# Patient Record
Sex: Female | Born: 1955 | Race: White | Hispanic: No | Marital: Married | Smoking: Never smoker
Health system: Southern US, Community
[De-identification: ages and names within clinical notes are randomized; demographics above are authoritative.]

## PROBLEM LIST (undated history)

## (undated) DIAGNOSIS — F319 Bipolar disorder, unspecified: Secondary | ICD-10-CM

## (undated) DIAGNOSIS — K269 Duodenal ulcer, unspecified as acute or chronic, without hemorrhage or perforation: Secondary | ICD-10-CM

## (undated) DIAGNOSIS — R519 Headache, unspecified: Secondary | ICD-10-CM

## (undated) DIAGNOSIS — F32A Depression, unspecified: Secondary | ICD-10-CM

## (undated) DIAGNOSIS — M1990 Unspecified osteoarthritis, unspecified site: Secondary | ICD-10-CM

## (undated) DIAGNOSIS — E119 Type 2 diabetes mellitus without complications: Secondary | ICD-10-CM

## (undated) DIAGNOSIS — G4733 Obstructive sleep apnea (adult) (pediatric): Secondary | ICD-10-CM

## (undated) DIAGNOSIS — E785 Hyperlipidemia, unspecified: Secondary | ICD-10-CM

## (undated) DIAGNOSIS — R51 Headache: Secondary | ICD-10-CM

## (undated) DIAGNOSIS — E559 Vitamin D deficiency, unspecified: Secondary | ICD-10-CM

## (undated) DIAGNOSIS — F329 Major depressive disorder, single episode, unspecified: Secondary | ICD-10-CM

## (undated) DIAGNOSIS — G43909 Migraine, unspecified, not intractable, without status migrainosus: Secondary | ICD-10-CM

## (undated) DIAGNOSIS — K219 Gastro-esophageal reflux disease without esophagitis: Secondary | ICD-10-CM

## (undated) HISTORY — PX: CHOLECYSTECTOMY: SHX55

## (undated) HISTORY — PX: THUMB ARTHROSCOPY: SHX2509

## (undated) HISTORY — PX: JOINT REPLACEMENT: SHX530

## (undated) HISTORY — PX: TOTAL KNEE ARTHROPLASTY: SHX125

## (undated) HISTORY — PX: REPLACEMENT TOTAL KNEE: SUR1224

## (undated) HISTORY — PX: LIVER BIOPSY: SHX301

---

## 2004-05-29 DIAGNOSIS — E1169 Type 2 diabetes mellitus with other specified complication: Secondary | ICD-10-CM | POA: Insufficient documentation

## 2011-03-14 DIAGNOSIS — L821 Other seborrheic keratosis: Secondary | ICD-10-CM | POA: Insufficient documentation

## 2011-03-14 DIAGNOSIS — E785 Hyperlipidemia, unspecified: Secondary | ICD-10-CM | POA: Insufficient documentation

## 2011-03-14 DIAGNOSIS — K760 Fatty (change of) liver, not elsewhere classified: Secondary | ICD-10-CM | POA: Insufficient documentation

## 2011-03-14 DIAGNOSIS — G43909 Migraine, unspecified, not intractable, without status migrainosus: Secondary | ICD-10-CM | POA: Insufficient documentation

## 2011-03-14 DIAGNOSIS — K219 Gastro-esophageal reflux disease without esophagitis: Secondary | ICD-10-CM | POA: Insufficient documentation

## 2011-03-14 DIAGNOSIS — M1711 Unilateral primary osteoarthritis, right knee: Secondary | ICD-10-CM | POA: Insufficient documentation

## 2012-08-08 DIAGNOSIS — E221 Hyperprolactinemia: Secondary | ICD-10-CM | POA: Insufficient documentation

## 2013-03-04 DIAGNOSIS — G4733 Obstructive sleep apnea (adult) (pediatric): Secondary | ICD-10-CM | POA: Insufficient documentation

## 2013-05-11 DIAGNOSIS — E559 Vitamin D deficiency, unspecified: Secondary | ICD-10-CM | POA: Insufficient documentation

## 2013-05-11 DIAGNOSIS — Z79899 Other long term (current) drug therapy: Secondary | ICD-10-CM | POA: Insufficient documentation

## 2013-08-05 DIAGNOSIS — R7689 Other specified abnormal immunological findings in serum: Secondary | ICD-10-CM | POA: Insufficient documentation

## 2013-08-05 DIAGNOSIS — R768 Other specified abnormal immunological findings in serum: Secondary | ICD-10-CM | POA: Insufficient documentation

## 2013-10-06 DIAGNOSIS — R002 Palpitations: Secondary | ICD-10-CM | POA: Insufficient documentation

## 2013-11-10 DIAGNOSIS — R079 Chest pain, unspecified: Secondary | ICD-10-CM | POA: Insufficient documentation

## 2014-01-31 ENCOUNTER — Ambulatory Visit: Payer: Self-pay | Admitting: Family Medicine

## 2014-09-01 DIAGNOSIS — H109 Unspecified conjunctivitis: Secondary | ICD-10-CM | POA: Insufficient documentation

## 2014-11-06 DIAGNOSIS — E0590 Thyrotoxicosis, unspecified without thyrotoxic crisis or storm: Secondary | ICD-10-CM | POA: Insufficient documentation

## 2015-02-20 ENCOUNTER — Encounter: Payer: Self-pay | Admitting: Gynecology

## 2015-02-20 ENCOUNTER — Ambulatory Visit
Admission: EM | Admit: 2015-02-20 | Discharge: 2015-02-20 | Disposition: A | Discharge status: Home / Self Care | Payer: BC Managed Care – PPO | Attending: Internal Medicine | Admitting: Internal Medicine

## 2015-02-20 DIAGNOSIS — A6920 Lyme disease, unspecified: Secondary | ICD-10-CM

## 2015-02-20 HISTORY — DX: Vitamin D deficiency, unspecified: E55.9

## 2015-02-20 HISTORY — DX: Type 2 diabetes mellitus without complications: E11.9

## 2015-02-20 HISTORY — DX: Unspecified osteoarthritis, unspecified site: M19.90

## 2015-02-20 HISTORY — DX: Hyperlipidemia, unspecified: E78.5

## 2015-02-20 HISTORY — DX: Depression, unspecified: F32.A

## 2015-02-20 HISTORY — DX: Obstructive sleep apnea (adult) (pediatric): G47.33

## 2015-02-20 HISTORY — DX: Duodenal ulcer, unspecified as acute or chronic, without hemorrhage or perforation: K26.9

## 2015-02-20 HISTORY — DX: Major depressive disorder, single episode, unspecified: F32.9

## 2015-02-20 HISTORY — DX: Gastro-esophageal reflux disease without esophagitis: K21.9

## 2015-02-20 HISTORY — DX: Migraine, unspecified, not intractable, without status migrainosus: G43.909

## 2015-02-20 HISTORY — DX: Bipolar disorder, unspecified: F31.9

## 2015-02-20 MED ORDER — DOXYCYCLINE HYCLATE 100 MG PO CAPS: 100.0000 mg | ORAL_CAPSULE | Freq: Two times a day (BID) | ORAL | Status: DC

## 2015-02-20 MED ORDER — KETOCONAZOLE 2 % EX CREA: 1.0000 "application " | TOPICAL_CREAM | Freq: Every day | CUTANEOUS | Status: DC

## 2015-02-20 NOTE — Discharge Instructions (Signed)
Prescription for doxycycline (for lyme disease) was sent to the [facility] in [place]. Early lyme disease is a clinical diagnosis, so no labs were drawn today. Would consider possibility of tinea (ringworm) if rash does not resolve fairly quickly (marked improvement within 3-5 days) with treatment for lyme. Prescription for ketoconazole cream (anti fungal) was sent to pharmacy and can be started if not improving in a few days with lyme treatment.

## 2015-02-20 NOTE — ED Provider Notes (Signed)
CSN: [id number]     Arrival date & time 02/20/15  1001 History   First MD Initiated Contact with Patient 02/20/15 1047     Chief Complaint  Patient presents with  . Rash   HPI Patient is a 59 year old lady was recently diagnosed with duodenal ulcers. She has multiple medication sensitivities. She had some itching under her right breast this morning, and inspection revealed 2 concentric large ringlike red lesions. She does have a dog, although she is unaware of a specific tick bite. She has had some tactile temperature in the last couple days but no objective fever. She has had some achiness in her bilateral hips.  Past Medical History  Diagnosis Date  . Duodenal ulcer   . Migraine   . Arthritis   . Depression   . Diabetes mellitus without complication   . Bipolar 1 disorder   . Hyperlipidemia   . GERD (gastroesophageal reflux disease)   . Vitamin D deficiency   . OSA (obstructive sleep apnea)    Past Surgical History  Procedure Laterality Date  . Cesarean section    . Joint replacement    . Total knee arthroplasty    . Thumb arthroscopy    . Cholecystectomy    . 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   Substance Use Topics  . Smoking status: Never Smoker   . Smokeless tobacco: None  . Alcohol Use: No    Review of Systems  All other systems reviewed and are negative.   Allergies  Roxicodone; Statins; Tape; Ciprofloxacin; and Latuda  Home Medications   Prior to Admission medications   Medication Sig Start Date End Date Taking? Authorizing Provider  ARIPiprazole (ABILIFY) 2 MG tablet Take 2 mg by mouth daily.   Yes Historical Provider, MD  cholecalciferol (VITAMIN D) 1000 UNITS tablet Take 3,000 Units by mouth daily.   Yes Historical Provider, MD  glucosamine-chondroitin 500-400 MG tablet Take 1  tablet by mouth 3 (three) times daily.   Yes Historical Provider, MD  glucose blood test strip 1 each by Other route as needed for other. Use as instructed   Yes Historical Provider, MD  L-Methylfolate (DEPLIN PO) Take by mouth.   Yes Historical Provider, MD  lamoTRIgine (LAMICTAL) 200 MG tablet Take 200 mg by mouth daily.   Yes Historical Provider, MD  Lancet Device MISC by Does not apply route.   Yes Historical Provider, MD  Multiple Vitamin (MULTIVITAMIN) capsule Take 1 capsule by mouth daily.   Yes Historical Provider, MD  omeprazole (PRILOSEC) 40 MG capsule Take 40 mg by mouth daily.   Yes Historical Provider, MD  rizatriptan (MAXALT) 10 MG tablet Take 10 mg by mouth as needed for migraine. May repeat in 2 hours if needed   Yes Historical Provider, MD  doxycycline (VIBRAMYCIN) 100 MG capsule Take 1 capsule (100 mg total) by mouth 2 (two) times daily. 02/20/15   Eustace Moore, MD  ketoconazole (NIZORAL) 2 % cream Apply 1 application topically daily. 02/20/15   Eustace Moore, MD   Meds Ordered and Administered this Visit  Medications - No data to display  BP 119/65 mmHg  Pulse 67  Temp(Src) 97.7 F (36.5 C) (Oral)  Resp 18  Ht  (1.626 m)  Wt 211 lb (95.709 kg)  BMI 36.20  kg/m2  SpO2 100% No data found.   Physical Exam  Constitutional: She is oriented to person, place, and time. No distress.  Alert, nicely groomed  HENT:  Head: Atraumatic.  Eyes:  Conjugate gaze, no eye redness/drainage  Neck: Neck supple.  Cardiovascular: Normal rate.   Pulmonary/Chest: No respiratory distress.  Abdominal: She exhibits no distension.  Musculoskeletal: Normal range of motion.  No leg swelling  Neurological: She is alert and oriented to person, place, and time.  Skin: Skin is warm and dry.  Pink. No cyanosis Under the right breast somewhat medially there are 2 concentric red, nonblanching, slightly elevated ring lesions. No significant scale.  Nursing note and vitals reviewed.   ED  Course  Procedures (including critical care time)    MDM   1. Erythema migrans (Lyme disease)    Discharge Medication List as of 02/20/2015 11:03 AM    START taking these medications   Details  doxycycline (VIBRAMYCIN) 100 MG capsule Take 1 capsule (100 mg total) by mouth 2 (two) times daily., Starting 02/20/2015, Until Discontinued, Normal    ketoconazole (NIZORAL) 2 % cream Apply 1 application topically daily., Starting 02/20/2015, Until Discontinued, Normal       If rash is not starting to improve in a few days, would consider possibility of tinea and start rx for ketoconazole. Labs not done as early lyme disease with rash is a clinical diagnosis. Recheck or FU pcp/Timothy Daaleman or dermatology if persistent.    Eustace Moore, MD 02/20/15 [phone]

## 2015-02-20 NOTE — ED Notes (Signed)
Patient c/o rash under right breast notice this morning. Per pt itching.

## 2015-04-17 ENCOUNTER — Encounter: Payer: Self-pay | Admitting: Gynecology

## 2015-04-17 ENCOUNTER — Ambulatory Visit
Admission: EM | Admit: 2015-04-17 | Discharge: 2015-04-17 | Disposition: A | Discharge status: Home / Self Care | Payer: BC Managed Care – PPO | Attending: Family Medicine | Admitting: Family Medicine

## 2015-04-17 DIAGNOSIS — N390 Urinary tract infection, site not specified: Secondary | ICD-10-CM

## 2015-04-17 HISTORY — DX: Headache, unspecified: R51.9

## 2015-04-17 HISTORY — DX: Headache: R51

## 2015-04-17 LAB — URINALYSIS COMPLETE WITH MICROSCOPIC (ARMC ONLY)
BACTERIA UA: NONE SEEN
BILIRUBIN URINE: NEGATIVE
GLUCOSE, UA: NEGATIVE mg/dL
HGB URINE DIPSTICK: NEGATIVE
Ketones, ur: NEGATIVE mg/dL
Leukocytes, UA: NEGATIVE
NITRITE: NEGATIVE
Protein, ur: NEGATIVE mg/dL
Specific Gravity, Urine: 1.015 (ref 1.005–1.030)
pH: 6.5 (ref 5.0–8.0)

## 2015-04-17 MED ORDER — SULFAMETHOXAZOLE-TRIMETHOPRIM 800-160 MG PO TABS: 1.0000 | ORAL_TABLET | Freq: Two times a day (BID) | ORAL | Status: AC

## 2015-04-17 NOTE — ED Provider Notes (Signed)
Patient presents today with some generalized right flank discomfort, urinary urgency, and bilateral upper thigh discomfort for the last 2 days. Patient denies any fever, hematuria, nausea, vomiting, focal lower back pain, incontinence, foot drop. She states that she's had similar symptoms in the past that she had to be treated for UTI or she had to be hospitalized. She denies any known discogenic back problems.  ROS: Negative except mentioned above.  Vitals as per Epic. GENERAL: NAD HEENT: no pharyngeal erythema, no exudate RESP: CTA B CARD: RRR ABD: +BS, NT/ND, mild right flank tenderness MSK: no midline tenderness, FROM of back, -SLR NEURO: CN II-XII grossly intact   A/P: Right flank pain- will treat for UTI at this time, will send urine for culture, since urine analysis was not truly indicative of a UTI I've asked that the patient follow up on the culture in a few days and if positive to continue her antibiotic, if negative she is to stop the antibiotic and follow up with her primary care physician regarding her current symptoms and any further workup that is needed.   Jolene ProvostKirtida Estel Scholze, MD 04/17/15 [phone]

## 2015-04-17 NOTE — ED Notes (Signed)
Patient c/o lower right back pain. Pt. Also stated test urine at home at it was positive for UTI.

## 2015-04-18 ENCOUNTER — Encounter: Payer: Self-pay | Admitting: Gynecology

## 2015-04-19 LAB — URINE CULTURE

## 2015-04-22 ENCOUNTER — Telehealth: Payer: Self-pay | Admitting: Emergency Medicine

## 2015-04-22 NOTE — ED Notes (Signed)
Patient was notified that her urine culture result showed Insignificant Growth and [facility] for her to continue with her antibiotic.  Patient was instructed to finish her antibiotic and to follow-up here or with her PCP if her symptoms do not improve or worsen.  Patient verbalized understanding.

## 2015-05-06 DIAGNOSIS — E6609 Other obesity due to excess calories: Secondary | ICD-10-CM | POA: Insufficient documentation

## 2015-05-30 ENCOUNTER — Ambulatory Visit
Admission: EM | Admit: 2015-05-30 | Discharge: 2015-05-30 | Disposition: A | Discharge status: Home / Self Care | Payer: BC Managed Care – PPO | Attending: Family Medicine | Admitting: Family Medicine

## 2015-05-30 ENCOUNTER — Ambulatory Visit (INDEPENDENT_AMBULATORY_CARE_PROVIDER_SITE_OTHER): Payer: BC Managed Care – PPO

## 2015-05-30 DIAGNOSIS — S92251A Displaced fracture of navicular [scaphoid] of right foot, initial encounter for closed fracture: Secondary | ICD-10-CM | POA: Diagnosis not present

## 2015-05-30 NOTE — Discharge Instructions (Signed)
Tarsal Navicular Fracture A tarsal navicular fracture is a break in the navicular bone in your foot. The navicular bone is at the top of the middle of your foot. It is one of the bones in a group of bones called the tarsal bones. The navicular bone is wedged between other bones. Running and jumping put a lot of pressure on your navicular bone. Tarsal navicular fractures occur most often in athletes.  CAUSES  A tarsal navicular fracture can be caused by:  Severe twisting of your foot.  Something heavy falling on your foot.  Stress on the navicular bone from your foot striking the ground repeatedly (stress fracture). RISK FACTORS You may be at risk for a navicular fracture if you participate in high-impact activities such as:  Track and field.  Football.  Soccer.  Basketball.  Gymnastics.  Ballet dancing. Other risk factors include:  Being a woman with an irregular menstrual cycle.  Having a condition that causes your bones to become thin and brittle (osteoporosis).  Being a smoker.  Starting a new sport without being in good shape.  Wearing athletic shoes that do not fit well. SIGNS AND SYMPTOMS An aching pain at the top of your foot is the most common symptom. The pain may move down into the arch of your foot. The pain will get worse with activity and better with rest. Other symptoms may include:  Swelling on the top of your foot.  Pain when pressing on the top of your foot.  Pain when hopping on your foot. DIAGNOSIS  Your health care provider may suspect a tarsal navicular fracture if you recently injured your foot and have symptoms of a fracture. A physical exam will be done. During this exam, your health care provider may move your foot into different positions to check for pain. If you have pain when the health care provider presses on your navicular bone, then it is very likely that you have a navicular fracture. An X-ray of your foot may be done to help confirm the  diagnosis. Regular X-rays often do not show a stress fracture. You may need to have other imaging studies, such as:  A bone scan.  A CT scan.  An MRI. TREATMENT  Your health care provider will determine the best treatment for you based on the severity of your fracture.   If the broken bone is in good alignment, a cast or splint may be applied. The cast or splint will likely need to be worn for several weeks. While the cast or splint is on, you cannot put weight on your foot. You will need close follow-up with your health care provider to make sure you are healing.  Rarely, if the fracture is severe and the broken bone is out of place, your health care provider will need to align the fracture using a surgical procedure called open reduction and internal fixation (ORIF).  In the ORIF procedure, the fracture site is opened up, and the bone pieces are fixed into place with metal screws or pins.  After surgery, you may need to wear a cast or splint. You will also need close follow-up with your health care provider to make sure you are healing. HOME CARE INSTRUCTIONS   Use crutches as directed by your health care provider. Do not put weight on your injured foot until your health care provider approves.  If you have a plaster or fiberglass cast:   Do not try to scratch the skin under the cast with   sharp or pointed objects.  Check the skin around the cast every day. You may put lotion on any red or sore areas.   Keep your cast dry and clean.  Use a plastic bag to protect your cast or splint from water while bathing. Do not lower your cast or splint into water.  Take medicines only as directed by your health care provider.  Keep all follow-up visits as directed by your health care provider. This is important. SEEK MEDICAL CARE IF:   You have very bad pain, and medicine is not helping.  You have more than a small spot of bleeding from under your cast or splint.  You have drainage,  redness, or swelling at the injury site.  You have a fever.  You notice a bad smell coming from your cast or splint.   Your cast or splint cracks, breaks, or gets wet. SEEK IMMEDIATE MEDICAL CARE IF:   You begin to lose feeling in your foot or toes.  You have swelling in your foot or toes that is increasing.  Your foot or toes feel cold or turn blue.  You develop a rash.  MAKE SURE YOU:  Understand these instructions.  Will watch your condition.  Will get help right away if you are not doing well or get worse.   This information is not intended to replace advice given to you by your health care provider. Make sure you discuss any questions you have with your health care provider.   Document Released: 08/26/2000 Document Revised: 06/04/2014 Document Reviewed: 07/17/2013 Elsevier Interactive Patient Education [employer]2016 Elsevier Inc.

## 2015-05-30 NOTE — ED Provider Notes (Signed)
CSN: [id number]     Arrival date & time 05/30/15  0845 History   First MD Initiated Contact with Patient 05/30/15 0935     Chief Complaint  Patient presents with  . Foot Pain  . Ankle Pain   (Consider location/radiation/quality/duration/timing/severity/associated sxs/prior Treatment) HPI Comments: 60 yo female approximately 1 week h/o right foot and ankle pain. States one week ago had fallen asleep on recliner and when she got up her foot bent backwards; since then has had pain and swelling. Denies any fevers, chills, vomiting.   The history is provided by the patient.    Past Medical History  Diagnosis Date  . Migraine   . Bipolar 1 disorder (HCC)   . Hyperlipidemia   . GERD (gastroesophageal reflux disease)   . Vitamin D deficiency   . OSA (obstructive sleep apnea)   . Arthritis   . Depression   . Head ache     migrane  . Duodenal ulcer   . Diabetes mellitus without complication [facility])    Past Surgical History  Procedure Laterality Date  . Cesarean section    . Joint replacement    . Total knee arthroplasty    . Thumb arthroscopy    . Liver biopsy    . Cholecystectomy    . Replacement total knee Right    No family history on file. Social History  Substance Use Topics  . Smoking status: Never Smoker   . Smokeless tobacco: None  . Alcohol Use: No   OB History    Gravida Para Term Preterm AB TAB SAB Ectopic Multiple Living   0 0 0 0 0 0 0 0       Review of Systems  Allergies  Roxicodone; Carafate; Ciprofloxacin; Oxycodone; Statins; Tape; Tape; Ciprofloxacin; and Latuda  Home Medications   Prior to Admission medications   Medication Sig Start Date End Date Taking? Authorizing Provider  ARIPiprazole (ABILIFY) 2 MG tablet Take 2 mg by mouth daily.   Yes Historical Provider, MD  cholecalciferol (VITAMIN D) 1000 UNITS tablet Take 3,000 Units by mouth daily.   Yes Historical Provider, MD  glucosamine-chondroitin 500-400 MG tablet Take 1 tablet by mouth 3 (three)  times daily.   Yes Historical Provider, MD  glucose blood test strip 1 each by Other route as needed for other. Use as instructed   Yes Historical Provider, MD  L-Methylfolate (DEPLIN PO) Take by mouth.   Yes Historical Provider, MD  lamoTRIgine (LAMICTAL) 200 MG tablet Take 200 mg by mouth daily.   Yes Historical Provider, MD  Lancet Device MISC by Does not apply route.   Yes Historical Provider, MD  Multiple Vitamin (MULTIVITAMIN) capsule Take 1 capsule by mouth daily.   Yes Historical Provider, MD  omeprazole (PRILOSEC) 40 MG capsule Take 40 mg by mouth daily.   Yes Historical Provider, MD  rizatriptan (MAXALT) 10 MG tablet Take 10 mg by mouth as needed for migraine. May repeat in 2 hours if needed   Yes Historical Provider, MD  ARIPiprazole (ABILIFY) 2 MG tablet Take 2 mg by mouth daily.    Historical Provider, MD  cholecalciferol (VITAMIN D) 1000 UNITS tablet Take 1,000 Units by mouth daily.    Historical Provider, MD  doxycycline (VIBRAMYCIN) 100 MG capsule Take 1 capsule (100 mg total) by mouth 2 (two) times daily. 02/20/15   Eustace Moore, MD  ketoconazole (NIZORAL) 2 % cream Apply 1 application topically daily. 02/20/15   Eustace Moore, MD  lamoTRIgine (LAMICTAL) 200  MG tablet Take 200 mg by mouth daily.    Historical Provider, MD  Misc Natural Products ([facility] GLUCOS-CHONDROIT-MSM TS PO) Take by mouth.    Historical Provider, MD  omeprazole (PRILOSEC) 40 MG capsule Take 40 mg by mouth daily.    Historical Provider, MD   Meds Ordered and Administered this Visit  Medications - No data to display  BP 124/69 mmHg  Pulse 65  Temp(Src) 97.7 F (36.5 C) (Tympanic)  Resp 17  Ht 5\' 7"  (1.702 m)  Wt 215 lb (97.523 kg)  BMI 33.67 kg/m2  SpO2 99% No data found.   Physical Exam  Constitutional: She appears well-developed and well-nourished. No distress.  Musculoskeletal:       Right ankle: She exhibits decreased range of motion and swelling. She exhibits no ecchymosis, no deformity, no  laceration and normal pulse. Tenderness. Lateral malleolus tenderness found. Achilles tendon normal.       Right foot: There is decreased range of motion, tenderness, bony tenderness and swelling. There is normal capillary refill, no crepitus, no deformity and no laceration.  Skin: She is not diaphoretic.  Nursing note and vitals reviewed.   ED Course  Procedures (including critical care time)  Labs Review Labs Reviewed - No data to display  Imaging Review No results found.   Visual Acuity Review  Right Eye Distance:   Left Eye Distance:   Bilateral Distance:    Right Eye Near:   Left Eye Near:    Bilateral Near:         MDM   1. Avulsion fracture of navicular bone of right foot, closed, initial encounter    1. x-ray results and diagnosis reviewed with patient 2. Immobilized with cast boot 3. Recommend supportive treatment with otc analgesics 4. Follow-up prn if symptoms worsen or don't improve    Payton Mccallumrlando Aniyla Harling, MD 06/22/15 1100

## 2015-05-30 NOTE — ED Notes (Signed)
Patient states that she has right foot pain and right ankle pain. She states that one week ago she had fallen asleep in her recliner and foot had also fallen asleep, she states that she got up to walk on it and it bent backwards. She states that she only has swelling if she is on her feet all day. She states that pain is on top of foot and bottom. She states that ankle is also painful.

## 2015-12-07 DIAGNOSIS — R9431 Abnormal electrocardiogram [ECG] [EKG]: Secondary | ICD-10-CM | POA: Insufficient documentation

## 2015-12-07 DIAGNOSIS — Z8249 Family history of ischemic heart disease and other diseases of the circulatory system: Secondary | ICD-10-CM | POA: Insufficient documentation

## 2016-03-29 DIAGNOSIS — M1812 Unilateral primary osteoarthritis of first carpometacarpal joint, left hand: Secondary | ICD-10-CM | POA: Insufficient documentation

## 2016-09-27 IMAGING — CR DG ANKLE COMPLETE 3+V*R*
3 series · 3 of 3 positions shown · non-contrast
Comparison: None.

CLINICAL DATA: Pt with right ankle and right foot pain after
walking on her numb right foot after it fell asleep during a seated
position. Pt has anterior and posterior right foot pain and lateral
right ankle pain since date of injury 05/20/15. Initial encounter.

EXAM:
RIGHT ANKLE - COMPLETE 3+ VIEW

[ankle ap]
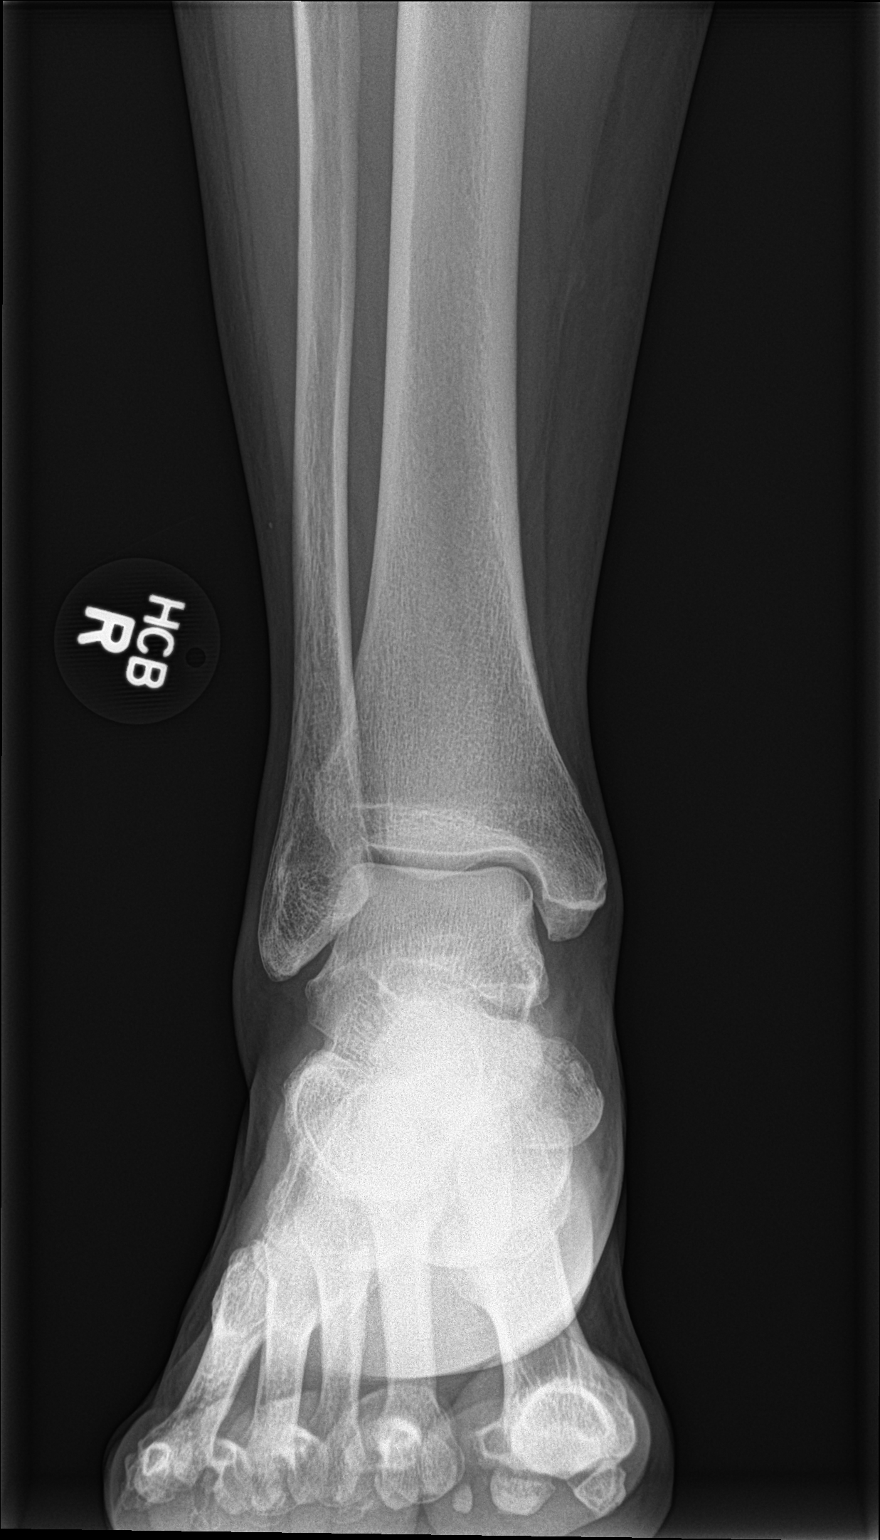

[ankle obl]
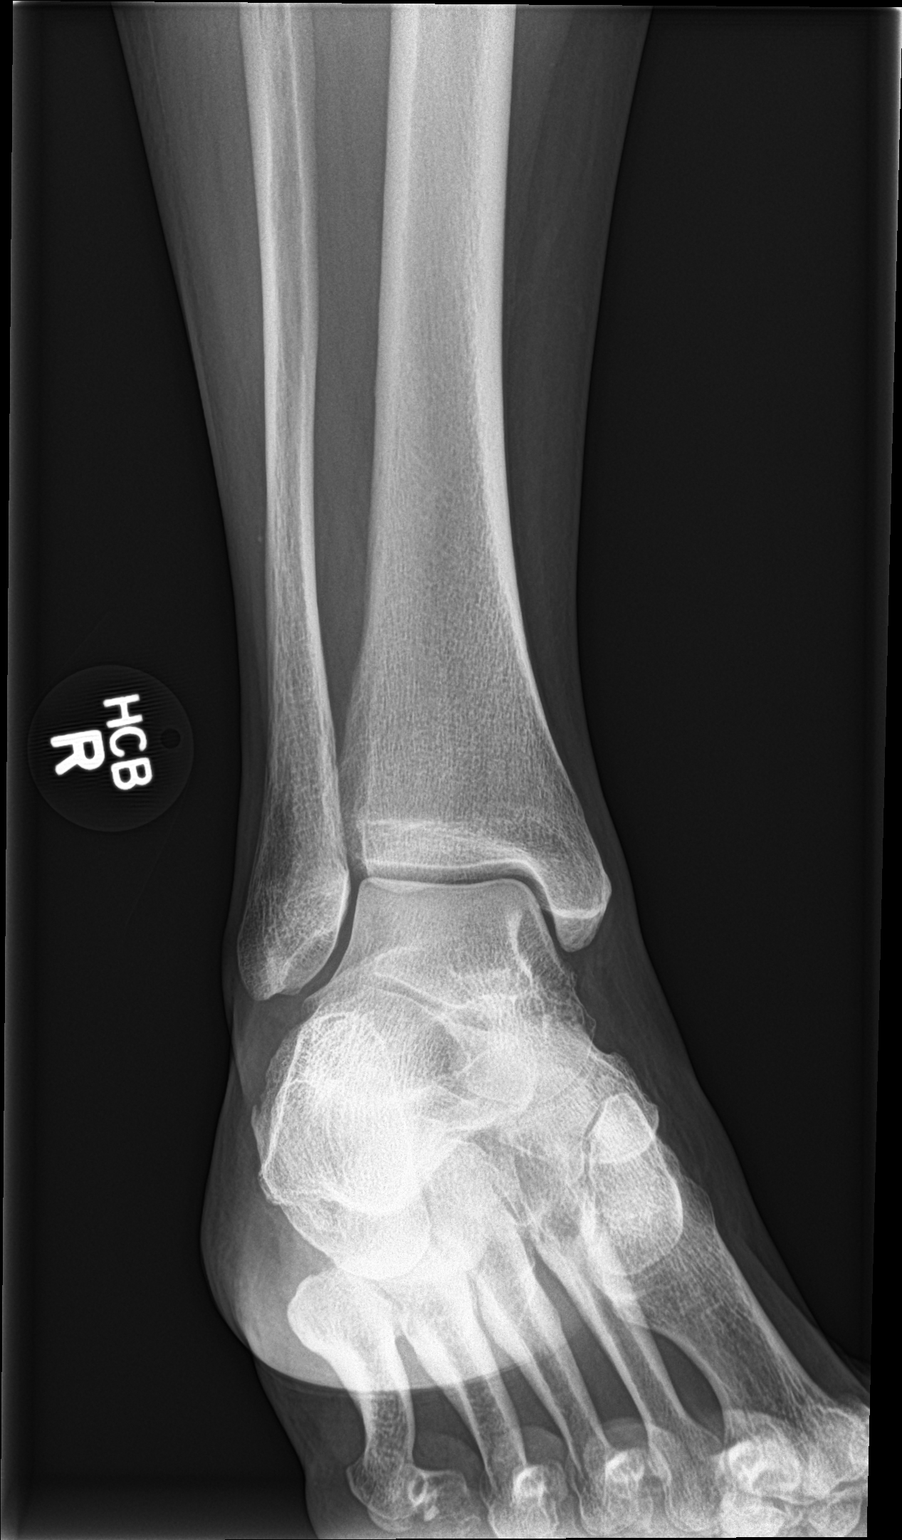

[ankle lat]
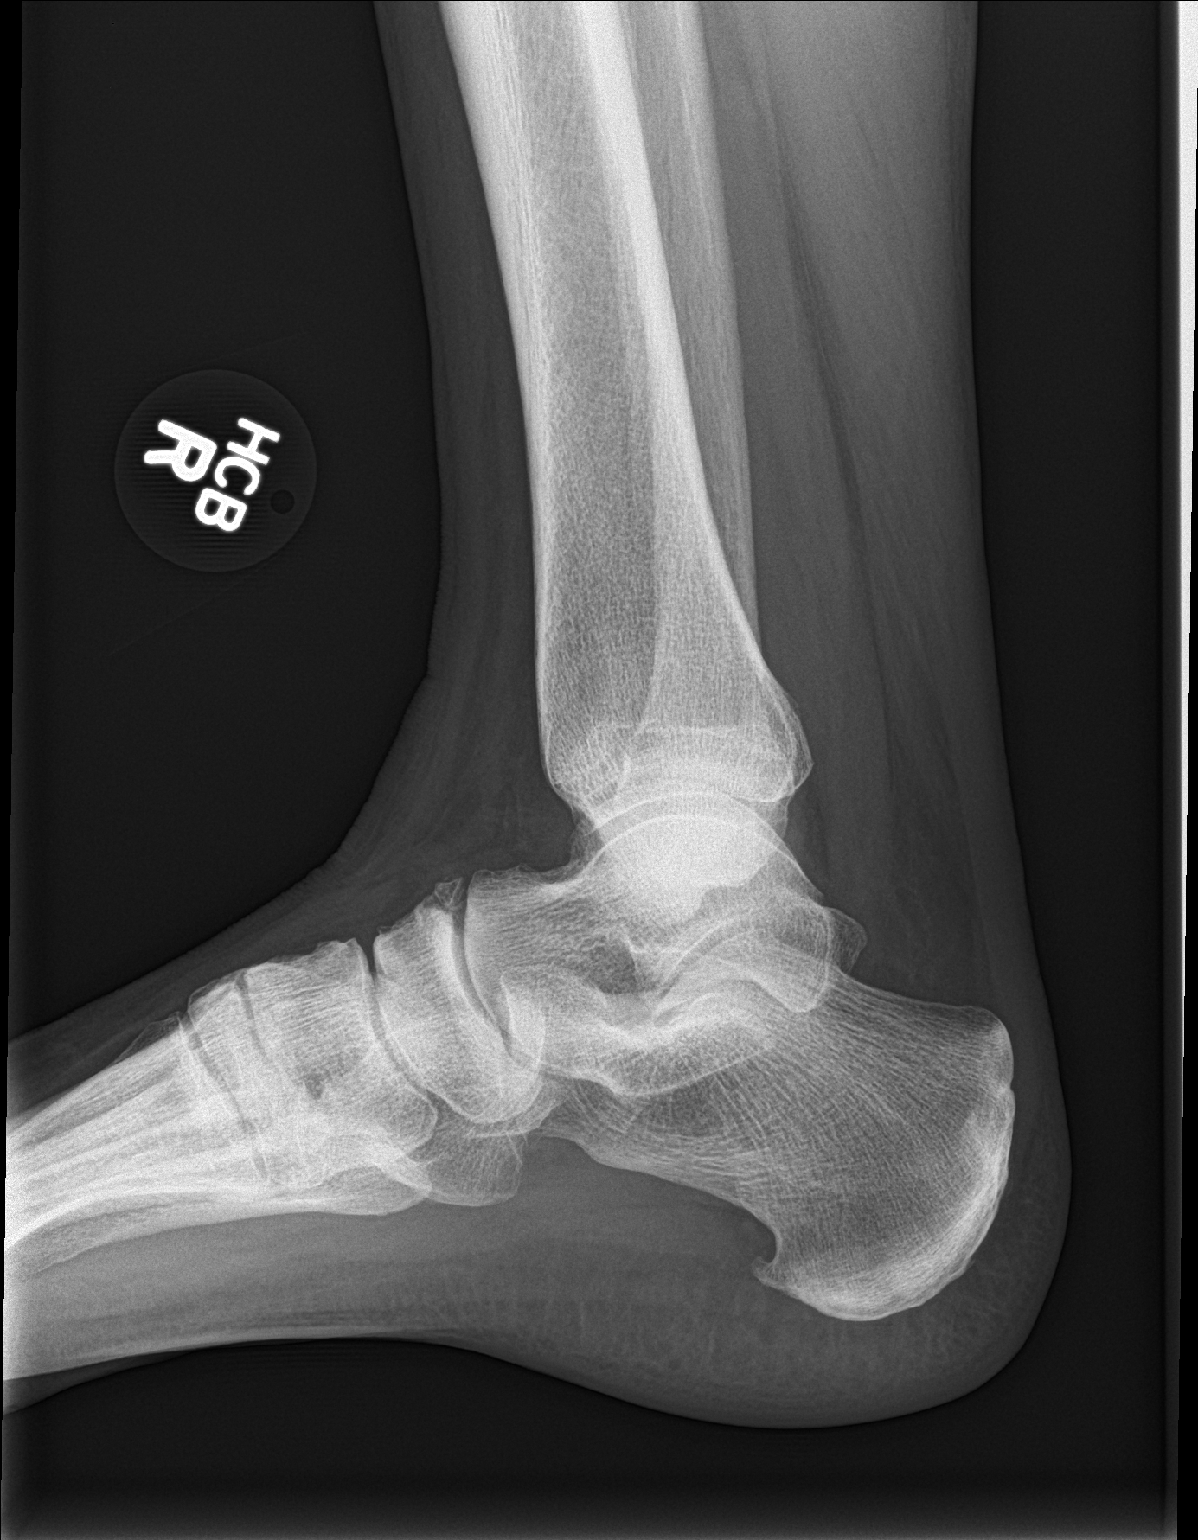

[3 of 3 positions shown; findings below may reference images not displayed]

FINDINGS: Normal anatomic alignment. Along the dorsal aspect of the navicular
on the lateral view there is suggestion of an oblique lucency
through the dorsal most aspect of the bone which extends to the
talonavicular joint. Plantar calcaneal spurring. Tailor dome is
intact. No soft tissue swelling.
IMPRESSION: Oblique lucency through the dorsal aspect of the navicular bone on
lateral view may be secondary to degenerative changes or
nondisplaced fracture. Recommend correlation with point tenderness.

## 2017-01-17 DIAGNOSIS — H9202 Otalgia, left ear: Secondary | ICD-10-CM | POA: Insufficient documentation

## 2017-01-17 DIAGNOSIS — H60332 Swimmer's ear, left ear: Secondary | ICD-10-CM | POA: Insufficient documentation

## 2017-04-15 DIAGNOSIS — M1990 Unspecified osteoarthritis, unspecified site: Secondary | ICD-10-CM | POA: Insufficient documentation

## 2017-06-27 DIAGNOSIS — Z72820 Sleep deprivation: Secondary | ICD-10-CM | POA: Insufficient documentation

## 2017-06-27 DIAGNOSIS — D649 Anemia, unspecified: Secondary | ICD-10-CM | POA: Insufficient documentation

## 2017-10-30 DIAGNOSIS — I781 Nevus, non-neoplastic: Secondary | ICD-10-CM | POA: Insufficient documentation

## 2017-11-27 ENCOUNTER — Ambulatory Visit: Payer: BC Managed Care – PPO | Attending: Family Medicine

## 2017-11-27 DIAGNOSIS — G4733 Obstructive sleep apnea (adult) (pediatric): Secondary | ICD-10-CM | POA: Diagnosis present

## 2018-04-17 DIAGNOSIS — R42 Dizziness and giddiness: Secondary | ICD-10-CM | POA: Insufficient documentation

## 2018-04-17 DIAGNOSIS — R0981 Nasal congestion: Secondary | ICD-10-CM | POA: Insufficient documentation

## 2018-04-28 DIAGNOSIS — I959 Hypotension, unspecified: Secondary | ICD-10-CM | POA: Insufficient documentation

## 2018-10-15 ENCOUNTER — Encounter: Payer: Self-pay | Admitting: Podiatry

## 2018-10-15 ENCOUNTER — Other Ambulatory Visit: Payer: Self-pay

## 2018-10-15 ENCOUNTER — Ambulatory Visit (INDEPENDENT_AMBULATORY_CARE_PROVIDER_SITE_OTHER): Payer: BC Managed Care – PPO

## 2018-10-15 ENCOUNTER — Other Ambulatory Visit: Payer: Self-pay | Admitting: Podiatry

## 2018-10-15 ENCOUNTER — Ambulatory Visit: Payer: BC Managed Care – PPO | Admitting: Podiatry

## 2018-10-15 VITALS — Temp 97.6°F

## 2018-10-15 DIAGNOSIS — M779 Enthesopathy, unspecified: Secondary | ICD-10-CM | POA: Diagnosis not present

## 2018-10-15 DIAGNOSIS — M7751 Other enthesopathy of right foot: Secondary | ICD-10-CM

## 2018-10-15 DIAGNOSIS — M778 Other enthesopathies, not elsewhere classified: Secondary | ICD-10-CM

## 2018-10-15 MED ORDER — METHYLPREDNISOLONE 4 MG PO TBPK: ORAL_TABLET | ORAL | 0 refills | Status: AC

## 2018-10-15 NOTE — Progress Notes (Addendum)
Subjective:  Patient ID: Latasha Case, female    DOB: 1956-04-28,  MRN: [id number] HPI Chief Complaint  Patient presents with  . Foot Pain    Patient presents today for bilat foot pain, right ankle pain.  She reports her right foot/ ankle has been hurting x years, She states the pain feels like sharp, stabbing, constant at times, sometimes throbbing."   She has had multiple injections in the past and taken Tylenol and stretching with no help.    63 y.o. female presents with the above complaint.   ROS: She denies fever chills nausea vomiting muscle aches pains calf pain back pain chest pain shortness of breath.  Past Medical History:  Diagnosis Date  . Arthritis   . Bipolar 1 disorder (HCC)   . Depression   . Diabetes mellitus without complication (HCC)   . Duodenal ulcer   . GERD (gastroesophageal reflux disease)   . Head ache    migrane  . Hyperlipidemia   . Migraine   . OSA (obstructive sleep apnea)   . Vitamin D deficiency    Past Surgical History:  Procedure Laterality Date  . CESAREAN SECTION    . CHOLECYSTECTOMY    . JOINT REPLACEMENT    . LIVER BIOPSY    . REPLACEMENT TOTAL KNEE Right   . THUMB ARTHROSCOPY    . TOTAL KNEE ARTHROPLASTY      Current Outpatient Medications:  .  amoxicillin (AMOXIL) 500 MG capsule, TAKE 4 CAPSULES 1 HOUR PRIOR TO DENTAL APPT, Disp: , Rfl:  .  atorvastatin (LIPITOR) 20 MG tablet, Take by mouth., Disp: , Rfl:  .  metFORMIN (GLUCOPHAGE) 500 MG tablet, Take by mouth., Disp: , Rfl:  .  acetaminophen (TYLENOL) 500 MG tablet, Take by mouth., Disp: , Rfl:  .  ALPRAZolam (XANAX) 0.5 MG tablet, TAKE 1 TABLET TWICE A DAY AS NEEDED FOR ANXIETY, Disp: , Rfl:  .  ARIPiprazole (ABILIFY) 2 MG tablet, Take 2 mg by mouth daily., Disp: , Rfl:  .  aspirin EC 81 MG tablet, Take by mouth., Disp: , Rfl:  .  cholecalciferol (VITAMIN D) 1000 UNITS tablet, Take 1,000 Units by mouth daily., Disp: , Rfl:  .  glucosamine-chondroitin 500-400 MG tablet,  Take 1 tablet by mouth 3 (three) times daily., Disp: , Rfl:  .  glucose blood test strip, 1 each by Other route as needed for other. Use as instructed, Disp: , Rfl:  .  L-Methylfolate (DEPLIN PO), Take by mouth., Disp: , Rfl:  .  lamoTRIgine (LAMICTAL) 200 MG tablet, Take 200 mg by mouth daily., Disp: , Rfl:  .  Lancet Device MISC, by Does not apply route., Disp: , Rfl:  .  Magnesium 400 MG CAPS, Take by mouth., Disp: , Rfl:  .  methylcellulose (CITRUCEL) oral powder, Take by mouth., Disp: , Rfl:  .  methylPREDNISolone (MEDROL DOSEPAK) 4 MG TBPK tablet, 6 day dose pack - take as directed, Disp: 21 tablet, Rfl: 0 .  Multiple Vitamin (MULTIVITAMIN) capsule, Take 1 capsule by mouth daily., Disp: , Rfl:  .  omeprazole (PRILOSEC) 40 MG capsule, Take 40 mg by mouth daily., Disp: , Rfl:  .  rizatriptan (MAXALT) 10 MG tablet, Take 10 mg by mouth as needed for migraine. May repeat in 2 hours if needed, Disp: , Rfl:  .  temazepam (RESTORIL) 15 MG capsule, Take 15 mg by mouth at bedtime., Disp: , Rfl:   Allergies  Allergen Reactions  . Roxicodone [Oxycodone Hcl] Shortness Of  Breath  . Carafate [Sucralfate] Swelling  . Ciprofloxacin   . Oxycodone   . Statins   . Tape     Silk tape  . Tape     silk  . Ciprofloxacin Rash  . Kasandra KnudsenLatuda [Lurasidone Hcl] Palpitations   Review of Systems Objective:   Vitals:   10/15/18 1120  Temp: 97.6 F (36.4 C)    General: Well developed, nourished, in no acute distress, alert and oriented x3   Dermatological: Skin is warm, dry and supple bilateral. Nails x 10 are well maintained; remaining integument appears unremarkable at this time. There are no open sores, no preulcerative lesions, no rash or signs of infection present.  Vascular: Dorsalis Pedis artery and Posterior Tibial artery pedal pulses are 2/4 bilateral with immedate capillary fill time. Pedal hair growth present. No varicosities and no lower extremity edema present bilateral.   Neruologic: Grossly  intact via light touch bilateral. Vibratory intact via tuning fork bilateral. Protective threshold with Semmes Wienstein monofilament intact to all pedal sites bilateral. Patellar and Achilles deep tendon reflexes 2+ bilateral. No Babinski or clonus noted bilateral.   Musculoskeletal: No gross boney pedal deformities bilateral. No pain, crepitus, or limitation noted with foot and ankle range of motion bilateral. Muscular strength 5/5 in all groups tested bilateral.  She has pain on palpation of frontal plane range of motion of the TMT joints for second third right foot.  Soft tissue tenderness on palpation of the posterior tibial tendon with some fluctuance.  Knuckle ache no ankle joint pain  Gait: Unassisted, Nonantalgic.    Radiographs:  Radiographs taken today demonstrate osteoarthritic changes joint space narrowing of the second and third TMT's right foot.  Lesser degree on the left foot.  Soft tissue swelling medial ankle right no acute findings.  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  Talonavicular joint osteoarthritic change right  Plan: Discussed etiology pathology conservative therapies at this point time requested a CT possibly to be followed by an MRI for surgical consideration midfoot foot fusion.  I did put her on a Medrol Dosepak and I will follow-up with her and Dr. Logan BoresEvans for discussion of fusion of the midfoot.     Nicolus Ose T. ValierHyatt, [place] Dakota[place]PM

## 2018-10-22 DIAGNOSIS — M79676 Pain in unspecified toe(s): Secondary | ICD-10-CM

## 2018-10-27 ENCOUNTER — Other Ambulatory Visit: Payer: Self-pay

## 2018-10-27 DIAGNOSIS — M19079 Primary osteoarthritis, unspecified ankle and foot: Secondary | ICD-10-CM

## 2018-10-30 ENCOUNTER — Ambulatory Visit
Admission: RE | Admit: 2018-10-30 | Discharge: 2018-10-30 | Disposition: A | Discharge status: Home / Self Care | Payer: Medicare Other | Source: Ambulatory Visit | Attending: Podiatry | Admitting: Podiatry

## 2018-10-30 ENCOUNTER — Other Ambulatory Visit: Payer: Self-pay

## 2018-10-30 DIAGNOSIS — M19079 Primary osteoarthritis, unspecified ankle and foot: Secondary | ICD-10-CM | POA: Insufficient documentation

## 2018-11-03 ENCOUNTER — Ambulatory Visit: Payer: BC Managed Care – PPO | Admitting: Podiatry

## 2018-11-03 ENCOUNTER — Telehealth: Payer: Self-pay | Admitting: *Deleted

## 2018-11-03 NOTE — Telephone Encounter (Signed)
I informed pt of Dr. Stephenie Acres review of results and referral to Dr. Amalia Hailey. Pt states she is currently seeing another doctor and taking injections.

## 2018-11-03 NOTE — Telephone Encounter (Signed)
-----   Message from Garrel Ridgel, [place] sent at 11/03/2018  7:11 AM EDT ----- Please make an appointment for DR.  EVANS for a surgical consult.

## 2018-11-04 ENCOUNTER — Ambulatory Visit: Payer: Self-pay

## 2020-02-28 IMAGING — CT CT OF THE RIGHT FOOT WITHOUT CONTRAST
3 series · 10 of 35 positions shown, 12 images · non-contrast
Comparison: None.

CLINICAL DATA: Chronic right foot and ankle pain for years

EXAM:
CT OF THE RIGHT FOOT WITHOUT CONTRAST
TECHNIQUE: Multidetector CT imaging of the right foot was performed according
to the standard protocol. Multiplanar CT image reconstructions were
also generated.

[Series 8: axial st · coronal · 0.27mm/px · 3 of 261 slices shown]
[im 62/261  bone]
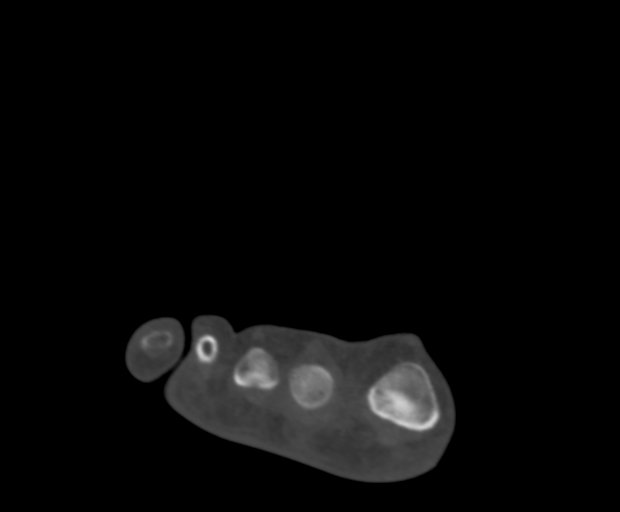
[im 108/261  bone]
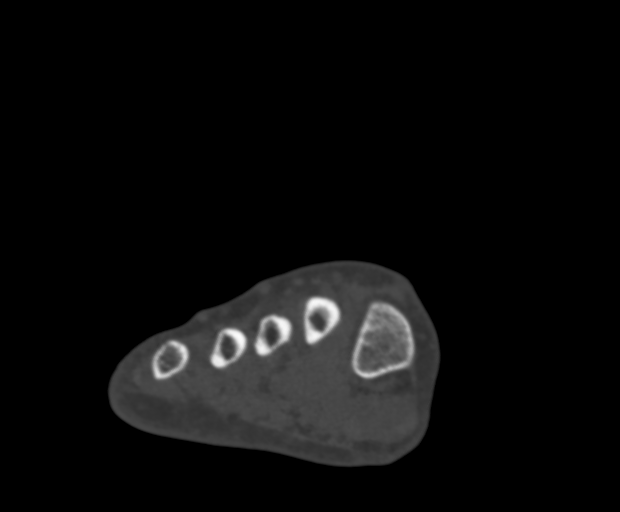
[im 153/261  bone]
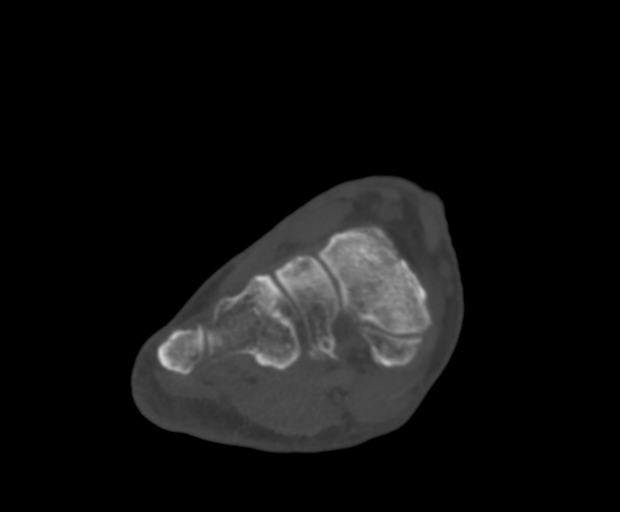

[Series 9: cor st · axial · 0.33mm/px · z∈[-24,+29]mm · 2 of 133 slices shown, 3 images]
[im 41/133  soft-tissue]
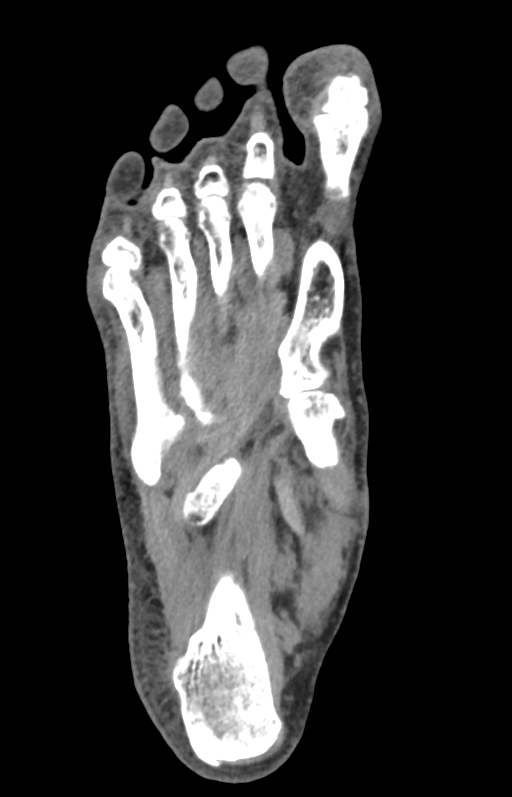
[im 41/133  bone]
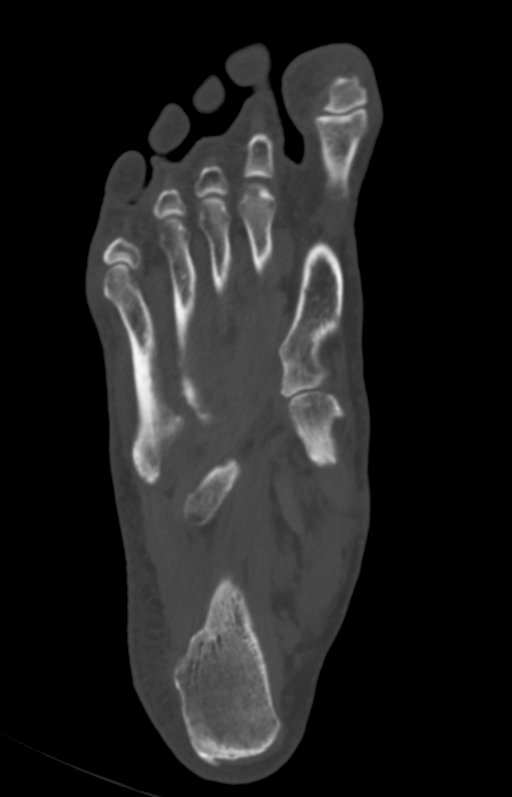
[im 102/133  bone]
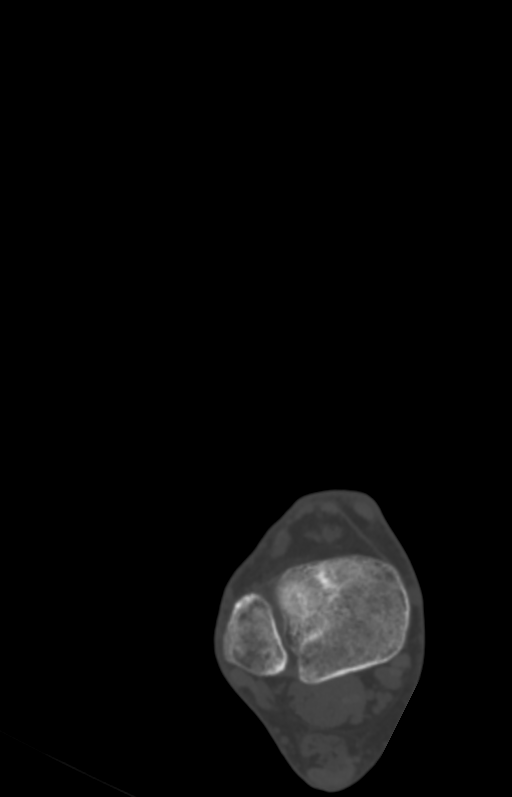

[Series 10: sag st · sagittal · 0.27mm/px · 5 of 117 slices shown, 6 images]
[im 39/117  bone]
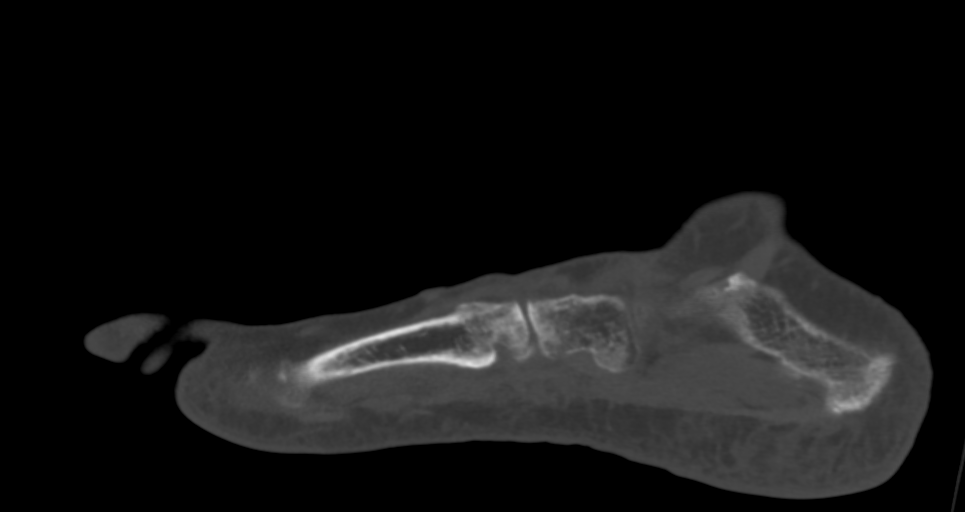
[im 49/117  bone]
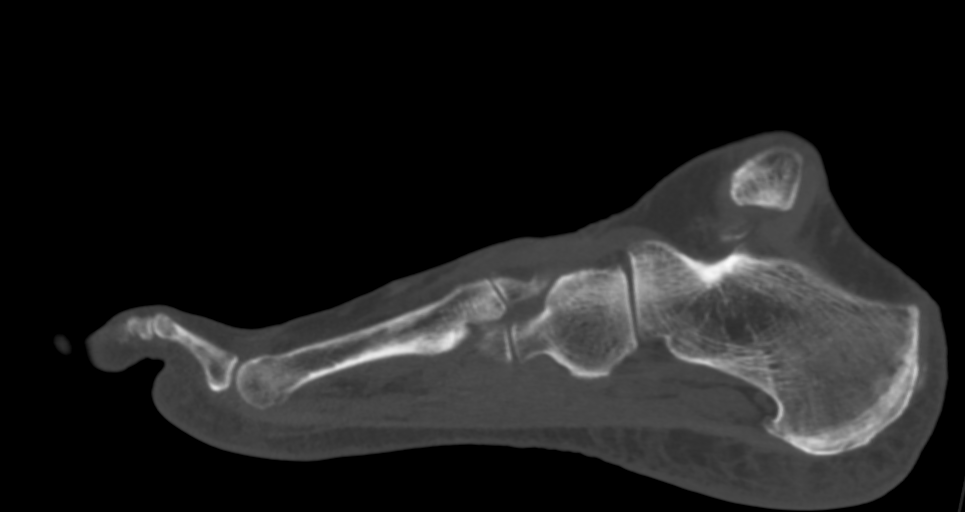
[im 59/117  soft-tissue]
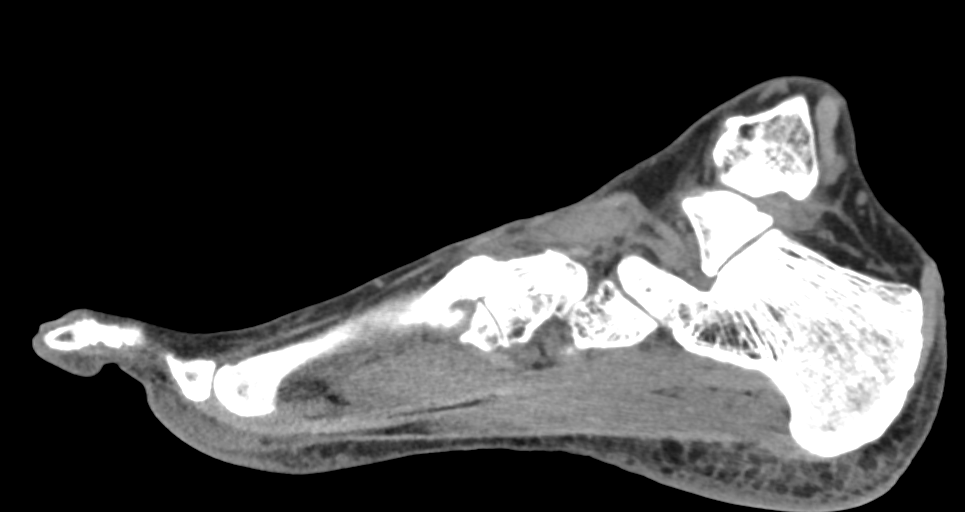
[im 59/117  bone]
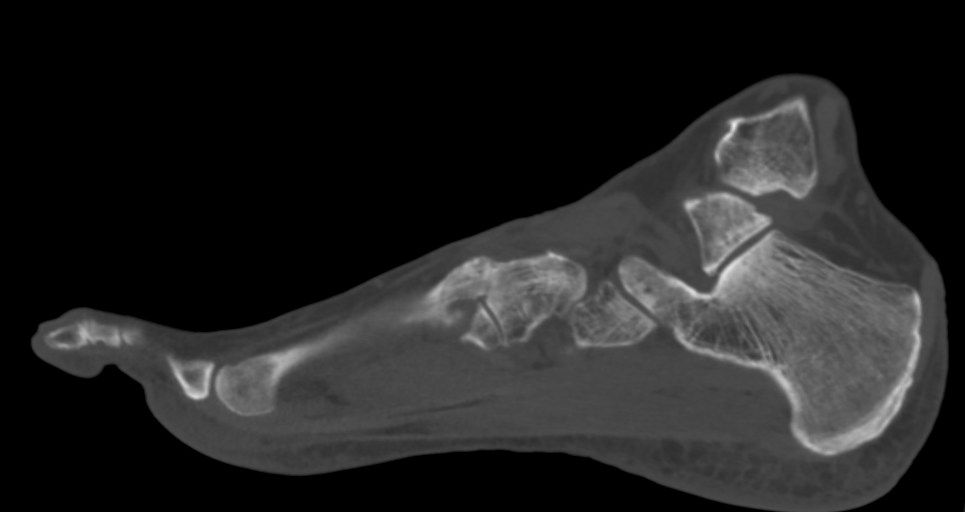
[im 68/117  bone]
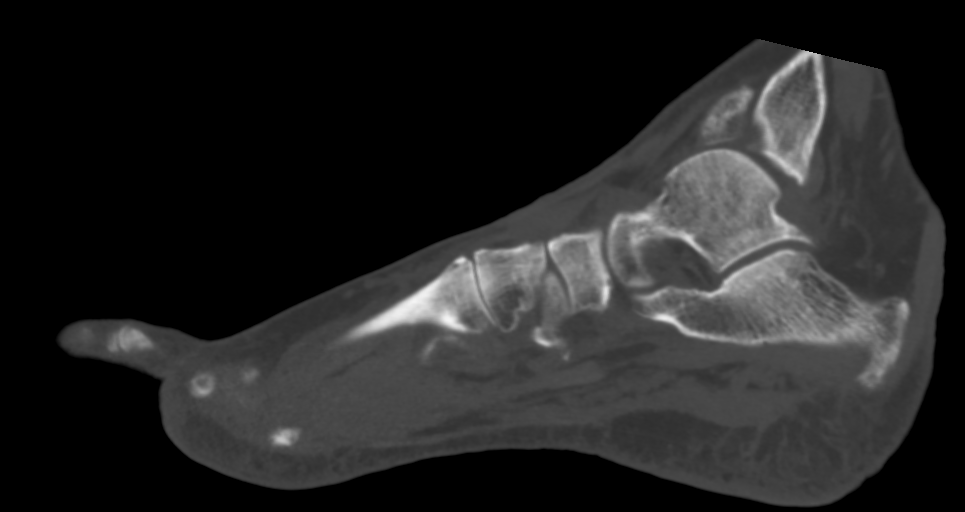
[im 78/117  bone]
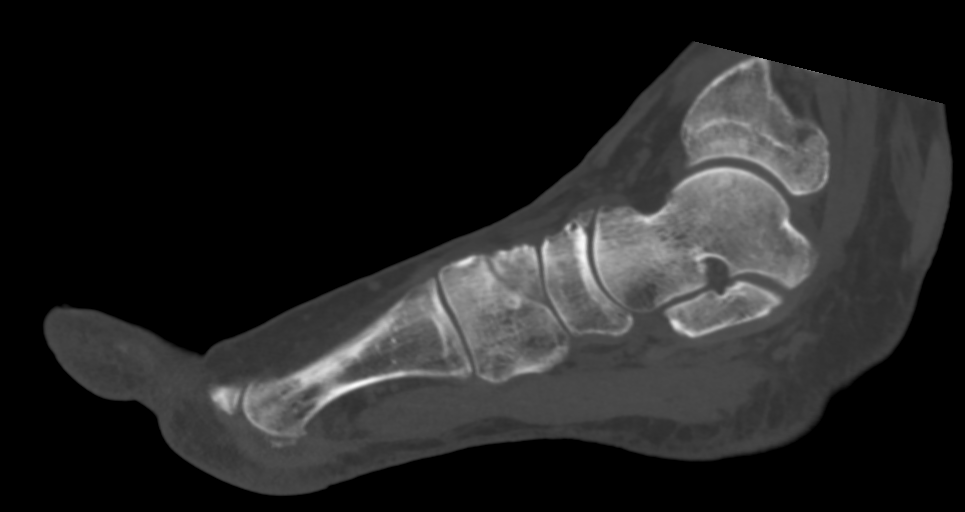

[10 of 35 positions shown; findings below may reference images not displayed]

FINDINGS: Bones/Joint/Cartilage

No acute fracture or dislocation. No aggressive osseous lesion. No
periosteal reaction or bone destruction. Ankle mortise is intact.
Subtalar joints are normal. Mild osteoarthritis of the second
tarsometatarsal joint. Bipartite medial hallux sesamoid.

Ligaments

Suboptimally assessed by CT.

Muscles and Tendons

Muscles are normal. Flexor, extensor, peroneal and Achilles tendons
are grossly intact.

Soft tissues

No fluid collection or hematoma.  No aggressive osseous lesion.
IMPRESSION: 1. Mild osteoarthritis of the second tarsometatarsal joint.
2.  No acute osseous injury of the right foot.
